# Patient Record
Sex: Male | Born: 1940 | Race: White | Hispanic: No | Marital: Married | Smoking: Never smoker
Health system: Southern US, Community
[De-identification: ages and names within clinical notes are randomized; demographics above are authoritative.]

## PROBLEM LIST (undated history)

## (undated) DIAGNOSIS — I10 Essential (primary) hypertension: Secondary | ICD-10-CM

## (undated) DIAGNOSIS — I2510 Atherosclerotic heart disease of native coronary artery without angina pectoris: Secondary | ICD-10-CM

## (undated) DIAGNOSIS — E119 Type 2 diabetes mellitus without complications: Secondary | ICD-10-CM

---

## 2017-02-22 DIAGNOSIS — N4340 Spermatocele of epididymis, unspecified: Secondary | ICD-10-CM | POA: Diagnosis not present

## 2017-03-25 DIAGNOSIS — J069 Acute upper respiratory infection, unspecified: Secondary | ICD-10-CM | POA: Diagnosis not present

## 2017-04-07 DIAGNOSIS — E782 Mixed hyperlipidemia: Secondary | ICD-10-CM | POA: Diagnosis not present

## 2017-04-07 DIAGNOSIS — I2510 Atherosclerotic heart disease of native coronary artery without angina pectoris: Secondary | ICD-10-CM | POA: Diagnosis not present

## 2017-04-07 DIAGNOSIS — Z951 Presence of aortocoronary bypass graft: Secondary | ICD-10-CM | POA: Diagnosis not present

## 2017-04-07 DIAGNOSIS — I10 Essential (primary) hypertension: Secondary | ICD-10-CM | POA: Diagnosis not present

## 2017-04-25 DIAGNOSIS — I10 Essential (primary) hypertension: Secondary | ICD-10-CM | POA: Diagnosis not present

## 2017-04-25 DIAGNOSIS — D649 Anemia, unspecified: Secondary | ICD-10-CM | POA: Diagnosis not present

## 2017-04-25 DIAGNOSIS — E782 Mixed hyperlipidemia: Secondary | ICD-10-CM | POA: Diagnosis not present

## 2017-04-25 DIAGNOSIS — E1169 Type 2 diabetes mellitus with other specified complication: Secondary | ICD-10-CM | POA: Diagnosis not present

## 2017-05-02 DIAGNOSIS — E782 Mixed hyperlipidemia: Secondary | ICD-10-CM | POA: Diagnosis not present

## 2017-05-02 DIAGNOSIS — J4520 Mild intermittent asthma, uncomplicated: Secondary | ICD-10-CM | POA: Diagnosis not present

## 2017-05-02 DIAGNOSIS — I129 Hypertensive chronic kidney disease with stage 1 through stage 4 chronic kidney disease, or unspecified chronic kidney disease: Secondary | ICD-10-CM | POA: Diagnosis not present

## 2017-05-02 DIAGNOSIS — E1169 Type 2 diabetes mellitus with other specified complication: Secondary | ICD-10-CM | POA: Diagnosis not present

## 2017-05-19 DIAGNOSIS — R0602 Shortness of breath: Secondary | ICD-10-CM | POA: Diagnosis not present

## 2017-05-20 ENCOUNTER — Encounter (HOSPITAL_BASED_OUTPATIENT_CLINIC_OR_DEPARTMENT_OTHER): Payer: Self-pay | Admitting: Emergency Medicine

## 2017-05-20 ENCOUNTER — Emergency Department (HOSPITAL_BASED_OUTPATIENT_CLINIC_OR_DEPARTMENT_OTHER)
Admission: EM | Admit: 2017-05-20 | Discharge: 2017-05-20 | Disposition: A | Discharge status: Home / Self Care | Payer: PPO | Attending: Emergency Medicine | Admitting: Emergency Medicine

## 2017-05-20 ENCOUNTER — Other Ambulatory Visit: Payer: Self-pay

## 2017-05-20 ENCOUNTER — Emergency Department (HOSPITAL_BASED_OUTPATIENT_CLINIC_OR_DEPARTMENT_OTHER): Payer: PPO

## 2017-05-20 DIAGNOSIS — Z7984 Long term (current) use of oral hypoglycemic drugs: Secondary | ICD-10-CM | POA: Diagnosis not present

## 2017-05-20 DIAGNOSIS — I10 Essential (primary) hypertension: Secondary | ICD-10-CM | POA: Insufficient documentation

## 2017-05-20 DIAGNOSIS — Z7902 Long term (current) use of antithrombotics/antiplatelets: Secondary | ICD-10-CM | POA: Diagnosis not present

## 2017-05-20 DIAGNOSIS — I2510 Atherosclerotic heart disease of native coronary artery without angina pectoris: Secondary | ICD-10-CM | POA: Diagnosis not present

## 2017-05-20 DIAGNOSIS — R0602 Shortness of breath: Secondary | ICD-10-CM | POA: Diagnosis not present

## 2017-05-20 DIAGNOSIS — E119 Type 2 diabetes mellitus without complications: Secondary | ICD-10-CM | POA: Insufficient documentation

## 2017-05-20 DIAGNOSIS — Z7982 Long term (current) use of aspirin: Secondary | ICD-10-CM | POA: Diagnosis not present

## 2017-05-20 DIAGNOSIS — Z79899 Other long term (current) drug therapy: Secondary | ICD-10-CM | POA: Diagnosis not present

## 2017-05-20 HISTORY — DX: Atherosclerotic heart disease of native coronary artery without angina pectoris: I25.10

## 2017-05-20 HISTORY — DX: Essential (primary) hypertension: I10

## 2017-05-20 HISTORY — DX: Type 2 diabetes mellitus without complications: E11.9

## 2017-05-20 LAB — CBC WITH DIFFERENTIAL/PLATELET
BASOS ABS: 0.1 10*3/uL (ref 0.0–0.1)
Basophils Relative: 1 %
EOS PCT: 4 %
Eosinophils Absolute: 0.2 10*3/uL (ref 0.0–0.7)
HEMATOCRIT: 34.5 % — AB (ref 39.0–52.0)
HEMOGLOBIN: 11.5 g/dL — AB (ref 13.0–17.0)
LYMPHS ABS: 2.6 10*3/uL (ref 0.7–4.0)
LYMPHS PCT: 43 %
MCH: 31.3 pg (ref 26.0–34.0)
MCHC: 33.3 g/dL (ref 30.0–36.0)
MCV: 94 fL (ref 78.0–100.0)
Monocytes Absolute: 0.7 10*3/uL (ref 0.1–1.0)
Monocytes Relative: 12 %
NEUTROS ABS: 2.3 10*3/uL (ref 1.7–7.7)
NEUTROS PCT: 40 %
PLATELETS: 201 10*3/uL (ref 150–400)
RBC: 3.67 MIL/uL — AB (ref 4.22–5.81)
RDW: 13.5 % (ref 11.5–15.5)
WBC: 5.8 10*3/uL (ref 4.0–10.5)

## 2017-05-20 LAB — COMPREHENSIVE METABOLIC PANEL WITH GFR
ALT: 24 U/L (ref 17–63)
AST: 31 U/L (ref 15–41)
Albumin: 4 g/dL (ref 3.5–5.0)
Alkaline Phosphatase: 42 U/L (ref 38–126)
Anion gap: 9 (ref 5–15)
BUN: 22 mg/dL — ABNORMAL HIGH (ref 6–20)
CO2: 20 mmol/L — ABNORMAL LOW (ref 22–32)
Calcium: 9.2 mg/dL (ref 8.9–10.3)
Chloride: 110 mmol/L (ref 101–111)
Creatinine, Ser: 1.28 mg/dL — ABNORMAL HIGH (ref 0.61–1.24)
GFR calc Af Amer: >60 mL/min
GFR calc non Af Amer: 52 mL/min — ABNORMAL LOW
Glucose, Bld: 144 mg/dL — ABNORMAL HIGH (ref 65–99)
Potassium: 4.1 mmol/L (ref 3.5–5.1)
Sodium: 139 mmol/L (ref 135–145)
Total Bilirubin: 0.6 mg/dL (ref 0.3–1.2)
Total Protein: 6.7 g/dL (ref 6.5–8.1)

## 2017-05-20 LAB — TROPONIN I: Troponin I: <0.03 ng/mL

## 2017-05-20 LAB — BRAIN NATRIURETIC PEPTIDE: B Natriuretic Peptide: 70.4 pg/mL (ref 0.0–100.0)

## 2017-05-20 NOTE — Discharge Instructions (Signed)
Return to the ED with any concerns including chest pain, fainting, leg swelling, worsening shortness of breath, vomiting, decreased level of alertness/lethargy, or any other alarming symptoms

## 2017-05-20 NOTE — ED Provider Notes (Signed)
[facility] EMERGENCY DEPARTMENT Provider Note   CSN: [id number] Arrival date & time: 05/20/17  0705     History   Chief Complaint Chief Complaint  Patient presents with  . Shortness of Breath    HPI Jesse Green is a 77 y.o. male.  HPI  Pt with hx of Chronic coronary artery disease status post drug-eluting stent to the SVG to OM branch 4/15, . S/P CABG (coronary artery bypass graft) 2005 , Essential hypertension presenting with c/o shortness of breath when lying flat.  He has no leg swelling.  He denies chest pain.  He states that he would be able to walk down the hall here in the ED without much shortness of breath but if he exerts himself very much he becomes short of breath.  This is resolved by rest.  He has had to sleep in a recliner several nights due to feeling short of breath.  He has not had any changes in his medications or his diet that he is aware of.  He has no fever or significant cough.  He went to his cardiologist's office yesterday and they advised him to have a chest x-ray.  He is speaking in full sentences and currently not short of breath in the stretcher.  There are no other associated systemic symptoms, there are no other alleviating or modifying factors.        Past Medical History:  Diagnosis Date  . Coronary artery disease   . Diabetes mellitus without complication ([place])   . Hypertension     There are no active problems to display for this patient.   Social history- married Fam hx - noncontributory     Home Medications    Prior to Admission medications   Medication Sig Start Date End Date Taking? Authorizing Provider  amLODipine (NORVASC) 10 MG tablet Take 1 tablet by mouth daily. 06/22/16  Yes [provider]  aspirin EC 81 MG tablet Take 1 tablet by mouth daily. 11/27/12  Yes [provider]  clopidogrel (PLAVIX) 75 MG tablet Take 1 tablet by mouth daily. 03/05/16  Yes [provider]  gabapentin  (NEURONTIN) 300 MG capsule Take 2 capsules by mouth 2 (two) times daily. 06/22/16  Yes [provider]  metFORMIN (GLUCOPHAGE) 1000 MG tablet Take 1 tablet by mouth 2 (two) times daily. 06/16/16  Yes [provider]  GLIPIZIDE XL 5 MG 24 hr tablet Take 1 tablet by mouth daily. 03/18/17   [provider]  losartan (COZAAR) 50 MG tablet Take 2 tablets by mouth daily. 04/28/17   [provider]  metoprolol (TOPROL-XL) 200 MG 24 hr tablet Take 200 mg by mouth daily. 04/28/17   [provider]  oxybutynin (DITROPAN-XL) 5 MG 24 hr tablet Take 1 tablet by mouth 2 (two) times daily. 03/22/17   [provider]  rosuvastatin (CRESTOR) 10 MG tablet Take 10 mg by mouth at bedtime. 04/29/17   [provider]  . amLODIPine (NORVASC) 10 MG tablet, TAKE 1 TABLET BY MOUTH DAILY, Disp: 90 tablet, Rfl: 1 . aspirin 81 MG EC tablet, Take by mouth daily. , Disp: , Rfl:  . azithromycin (ZITHROMAX) 250 MG tablet, TAKE 2 TABLETS BY MOUTH TODAY, THEN TAKE 1 TABLET DAILY FOR 4 DAYS, Disp: , Rfl: 0 . blood sugar diagnostic Strp, CHECK BLOOD SUGAR TWICE A DAY, Disp: 200 strip, Rfl: 1 . clopidogrel (PLAVIX) 75 mg tablet *ANTIPLATELET*, TAKE 1 TABLET BY MOUTH DAILY, Disp: 90 tablet, Rfl: 3 .  gabapentin (NEURONTIN) 300 MG capsule, TAKE 2 CAPSULES BY MOUTH EVERY EVENING, Disp: 180 capsule, Rfl: 1 . glipiZIDE XL (GLUCOTROL XL) 5 MG 24 hr tablet, Take 1 tablet (5 mg total) by mouth 2 times daily., Disp: 180 tablet, Rfl: 1 . losartan-hydrochlorothiazide (HYZAAR) 50-12.5 mg per tablet, TAKE 2 TABLETS BY MOUTH DAILY, Disp: 180 tablet, Rfl: 3 . metFORMIN (GLUCOPHAGE) 1000 MG tablet, TAKE 1 TABLET BY MOUTH TWICE A DAY WITH FOOD, Disp: 180 tablet, Rfl: 1 . metoprolol (TOPROL-XL) 200 MG 24 hr tablet, TAKE 1 TABLET BY MOUTH DAILY, Disp: 90 tablet, Rfl: 1 . multivit-iron-min-folic acid (MULTIVITAMIN-IRON-MINERALS-FOLIC ACID) 9,381-01-7.5 unit-mg-mg Chew, Take by mouth daily. , Disp: , Rfl:   . omega-3 acid ethyl esters (LOVAZA) 1 gram capsule, Take 2 g by mouth 2 (two) times daily. , Disp: , Rfl:  . oxybutynin XL (DITROPAN XL) 5 MG extended release tablet, Take 1 tablet (5 mg total) by mouth 2 times daily., Disp: 60 tablet, Rfl: PRN . rosuvastatin (CRESTOR) 10 MG tablet, TAKE 1 TABLET BY MOUTH NIGHTLY, Disp: 90 tablet, Rfl: 1 . triamcinolone (KENALOG) 0.1 % ointment, Use on affected areas 1-2 times daily as needed for itching. Not to use on face, groin, armpit., Disp: 80 g, Rfl: 3 . miscellaneous medical supply Misc, Lift Chair, Disp: , Rfl:  . nitroglycerin (NITROSTAT) 0.4 MG SL tablet, Place 1 tablet (0.4 mg total) under the tongue every 5 (five) minutes as needed., Disp: 25 tablet, Rfl: 3      Family History History reviewed. No pertinent family history.  Social History Social History   Tobacco Use  . Smoking status: Never Smoker  . Smokeless tobacco: Never Used  Substance Use Topics  . Alcohol use: Never    Frequency: Never  . Drug use: Never     Allergies   Allopurinol; Hydrocodone-acetaminophen; Quinapril; Propafenone; and Tramadol   Review of Systems Review of Systems  ROS reviewed and all otherwise negative except for mentioned in HPI   Physical Exam Updated Vital Signs BP 119/84   Pulse 60   Temp 98.3 F (36.8 C)   Resp (!) 22   Ht 5\' 9"  (1.753 m)   Wt 86.8 kg (191 lb 5 oz)   SpO2 96%   BMI 28.25 kg/m  Vitals reviewed Physical Exam  Physical Examination: General appearance - alert, well appearing, and in no distress Mental status - alert, oriented to person, place, and time Eyes - no conjunctival injection, no scleral icterus Mouth - mucous membranes moist, pharynx normal without lesions Neck - supple, no significant adenopathy Chest - clear to auscultation, no wheezes, rales or rhonchi, symmetric air entry Heart - normal rate, regular rhythm, normal S1, S2, no murmurs, rubs, clicks or gallops Abdomen - soft, nontender, nondistended, no  masses or organomegaly Neurological - alert, oriented, normal speech Extremities - peripheral pulses normal, no pedal edema, no clubbing or cyanosis Skin - normal coloration and turgor, no rashes   ED Treatments / Results  Labs (all labs ordered are listed, but only abnormal results are displayed) Labs Reviewed  CBC WITH DIFFERENTIAL/PLATELET - Abnormal; Notable for the following components:      Result Value   RBC 3.67 (*)    Hemoglobin 11.5 (*)    HCT 34.5 (*)    All other components within normal limits  COMPREHENSIVE METABOLIC PANEL - Abnormal; Notable for the following components:   CO2 20 (*)    Glucose, Bld 144 (*)    BUN 22 (*)  Creatinine, Ser 1.28 (*)    GFR calc non Af Amer 52 (*)    All other components within normal limits  BRAIN NATRIURETIC PEPTIDE  TROPONIN I  TROPONIN I    EKG EKG Interpretation  Date/Time:  Friday May 20 2017 07:22:06 EDT Ventricular Rate:  64 PR Interval:    QRS Duration: 149 QT Interval:  424 QTC Calculation: 438 R Axis:   3 Text Interpretation:  Sinus rhythm Atrial premature complex Prolonged PR interval Right bundle branch block No old tracing to compare Confirmed by Alfonzo Beers [id number]) on 05/20/2017 7:33:32 AM   Radiology Dg Chest 2 View  Result Date: 05/20/2017 CLINICAL DATA:  Shortness of breath for several months EXAM: CHEST - 2 VIEW COMPARISON:  11/03/2010 FINDINGS: Cardiac shadow is within normal limits. The lungs are well aerated bilaterally. Postsurgical changes are again seen and stable. No bony abnormality is noted. IMPRESSION: No active cardiopulmonary disease. Electronically Signed   By: Inez Catalina M.D.   On: 05/20/2017 08:16    Procedures Procedures (including critical care time)  Medications Ordered in ED Medications - No data to display   Initial Impression / Assessment and Plan / ED Course  I have reviewed the triage vital signs and the nursing notes.  Pertinent labs & imaging results that were  available during my care of the patient were reviewed by me and considered in my medical decision making (see chart for details).     Patient with history of coronary artery disease diabetes and hypertension presents with complaint of shortness of breath when he lies down.  He has no active shortness of breath or chest pain in the ED.  His workup including chest x-ray and 2 sets of troponins and EKG were reassuring.  Doubt heart failure, ACS, pneumonia.  Symptoms are not consistent with PE.  He plans to call his cardiologist today to schedule a follow-up appointment.  Discharged with strict return precautions.  Pt agreeable with plan.  Final Clinical Impressions(s) / ED Diagnoses   Final diagnoses:  Shortness of breath    ED Discharge Orders    None       Pixie Casino, MD 05/20/17 1224

## 2017-05-20 NOTE — ED Triage Notes (Signed)
Pt states he is having some sob when lying flat x 2 months.  Pt does have some DOE at baseline but seems to have increased a tiny bit.  Pt states no increase in swelling.  Pt states he tried to see his cardiologist yesterday but was told to come to ED.

## 2017-06-08 DIAGNOSIS — L812 Freckles: Secondary | ICD-10-CM | POA: Diagnosis not present

## 2017-06-08 DIAGNOSIS — L821 Other seborrheic keratosis: Secondary | ICD-10-CM | POA: Diagnosis not present

## 2017-06-08 DIAGNOSIS — D1801 Hemangioma of skin and subcutaneous tissue: Secondary | ICD-10-CM | POA: Diagnosis not present

## 2017-06-08 DIAGNOSIS — L578 Other skin changes due to chronic exposure to nonionizing radiation: Secondary | ICD-10-CM | POA: Diagnosis not present

## 2017-06-08 DIAGNOSIS — D229 Melanocytic nevi, unspecified: Secondary | ICD-10-CM | POA: Diagnosis not present

## 2017-06-08 DIAGNOSIS — I781 Nevus, non-neoplastic: Secondary | ICD-10-CM | POA: Diagnosis not present

## 2017-06-08 DIAGNOSIS — L814 Other melanin hyperpigmentation: Secondary | ICD-10-CM | POA: Diagnosis not present

## 2017-06-08 DIAGNOSIS — L570 Actinic keratosis: Secondary | ICD-10-CM | POA: Diagnosis not present

## 2017-06-08 DIAGNOSIS — Z1283 Encounter for screening for malignant neoplasm of skin: Secondary | ICD-10-CM | POA: Diagnosis not present

## 2017-06-13 DIAGNOSIS — C61 Malignant neoplasm of prostate: Secondary | ICD-10-CM | POA: Diagnosis not present

## 2017-06-20 DIAGNOSIS — C61 Malignant neoplasm of prostate: Secondary | ICD-10-CM | POA: Diagnosis not present

## 2017-06-20 DIAGNOSIS — N4340 Spermatocele of epididymis, unspecified: Secondary | ICD-10-CM | POA: Diagnosis not present

## 2017-06-20 DIAGNOSIS — N433 Hydrocele, unspecified: Secondary | ICD-10-CM | POA: Diagnosis not present

## 2017-07-19 DIAGNOSIS — M25461 Effusion, right knee: Secondary | ICD-10-CM | POA: Diagnosis not present

## 2017-07-19 DIAGNOSIS — M170 Bilateral primary osteoarthritis of knee: Secondary | ICD-10-CM | POA: Diagnosis not present

## 2017-07-19 DIAGNOSIS — M25561 Pain in right knee: Secondary | ICD-10-CM | POA: Diagnosis not present

## 2017-07-19 DIAGNOSIS — M25562 Pain in left knee: Secondary | ICD-10-CM | POA: Diagnosis not present

## 2017-07-19 DIAGNOSIS — M1711 Unilateral primary osteoarthritis, right knee: Secondary | ICD-10-CM | POA: Diagnosis not present

## 2017-07-25 DIAGNOSIS — R413 Other amnesia: Secondary | ICD-10-CM | POA: Diagnosis not present

## 2017-07-26 DIAGNOSIS — Z7902 Long term (current) use of antithrombotics/antiplatelets: Secondary | ICD-10-CM | POA: Diagnosis not present

## 2017-07-26 DIAGNOSIS — Z79899 Other long term (current) drug therapy: Secondary | ICD-10-CM | POA: Diagnosis not present

## 2017-07-26 DIAGNOSIS — I10 Essential (primary) hypertension: Secondary | ICD-10-CM | POA: Diagnosis not present

## 2017-07-26 DIAGNOSIS — I2510 Atherosclerotic heart disease of native coronary artery without angina pectoris: Secondary | ICD-10-CM | POA: Diagnosis not present

## 2017-07-26 DIAGNOSIS — I872 Venous insufficiency (chronic) (peripheral): Secondary | ICD-10-CM | POA: Diagnosis not present

## 2017-08-08 DIAGNOSIS — E1169 Type 2 diabetes mellitus with other specified complication: Secondary | ICD-10-CM | POA: Diagnosis not present

## 2017-08-08 DIAGNOSIS — D631 Anemia in chronic kidney disease: Secondary | ICD-10-CM | POA: Diagnosis not present

## 2017-08-08 DIAGNOSIS — I10 Essential (primary) hypertension: Secondary | ICD-10-CM | POA: Diagnosis not present

## 2017-08-08 DIAGNOSIS — N183 Chronic kidney disease, stage 3 (moderate): Secondary | ICD-10-CM | POA: Diagnosis not present

## 2017-08-08 DIAGNOSIS — E782 Mixed hyperlipidemia: Secondary | ICD-10-CM | POA: Diagnosis not present

## 2017-08-15 DIAGNOSIS — N183 Chronic kidney disease, stage 3 (moderate): Secondary | ICD-10-CM | POA: Diagnosis not present

## 2017-08-15 DIAGNOSIS — I25119 Atherosclerotic heart disease of native coronary artery with unspecified angina pectoris: Secondary | ICD-10-CM | POA: Diagnosis not present

## 2017-08-15 DIAGNOSIS — Z0000 Encounter for general adult medical examination without abnormal findings: Secondary | ICD-10-CM | POA: Diagnosis not present

## 2017-08-15 DIAGNOSIS — I129 Hypertensive chronic kidney disease with stage 1 through stage 4 chronic kidney disease, or unspecified chronic kidney disease: Secondary | ICD-10-CM | POA: Diagnosis not present

## 2017-08-15 DIAGNOSIS — I739 Peripheral vascular disease, unspecified: Secondary | ICD-10-CM | POA: Diagnosis not present

## 2017-08-18 DIAGNOSIS — M25561 Pain in right knee: Secondary | ICD-10-CM | POA: Diagnosis not present

## 2017-08-18 DIAGNOSIS — M1711 Unilateral primary osteoarthritis, right knee: Secondary | ICD-10-CM | POA: Diagnosis not present

## 2017-08-18 DIAGNOSIS — M1712 Unilateral primary osteoarthritis, left knee: Secondary | ICD-10-CM | POA: Diagnosis not present

## 2017-08-18 DIAGNOSIS — M25562 Pain in left knee: Secondary | ICD-10-CM | POA: Diagnosis not present

## 2017-08-29 DIAGNOSIS — R0781 Pleurodynia: Secondary | ICD-10-CM | POA: Diagnosis not present

## 2017-08-29 DIAGNOSIS — S299XXA Unspecified injury of thorax, initial encounter: Secondary | ICD-10-CM | POA: Diagnosis not present

## 2017-08-29 DIAGNOSIS — G8921 Chronic pain due to trauma: Secondary | ICD-10-CM | POA: Diagnosis not present

## 2017-08-29 DIAGNOSIS — M549 Dorsalgia, unspecified: Secondary | ICD-10-CM | POA: Diagnosis not present

## 2017-09-07 DIAGNOSIS — H2513 Age-related nuclear cataract, bilateral: Secondary | ICD-10-CM | POA: Diagnosis not present

## 2017-09-07 DIAGNOSIS — H524 Presbyopia: Secondary | ICD-10-CM | POA: Diagnosis not present

## 2017-09-07 DIAGNOSIS — E119 Type 2 diabetes mellitus without complications: Secondary | ICD-10-CM | POA: Diagnosis not present

## 2017-09-07 DIAGNOSIS — H5203 Hypermetropia, bilateral: Secondary | ICD-10-CM | POA: Diagnosis not present

## 2017-09-17 DIAGNOSIS — Z7982 Long term (current) use of aspirin: Secondary | ICD-10-CM | POA: Diagnosis not present

## 2017-09-17 DIAGNOSIS — I4510 Unspecified right bundle-branch block: Secondary | ICD-10-CM | POA: Diagnosis not present

## 2017-09-17 DIAGNOSIS — Z951 Presence of aortocoronary bypass graft: Secondary | ICD-10-CM | POA: Diagnosis not present

## 2017-09-17 DIAGNOSIS — Z8673 Personal history of transient ischemic attack (TIA), and cerebral infarction without residual deficits: Secondary | ICD-10-CM | POA: Diagnosis not present

## 2017-09-17 DIAGNOSIS — I440 Atrioventricular block, first degree: Secondary | ICD-10-CM | POA: Diagnosis not present

## 2017-09-17 DIAGNOSIS — M109 Gout, unspecified: Secondary | ICD-10-CM | POA: Diagnosis not present

## 2017-09-17 DIAGNOSIS — I25118 Atherosclerotic heart disease of native coronary artery with other forms of angina pectoris: Secondary | ICD-10-CM | POA: Diagnosis not present

## 2017-09-17 DIAGNOSIS — E1142 Type 2 diabetes mellitus with diabetic polyneuropathy: Secondary | ICD-10-CM | POA: Diagnosis not present

## 2017-09-17 DIAGNOSIS — R072 Precordial pain: Secondary | ICD-10-CM | POA: Diagnosis not present

## 2017-09-17 DIAGNOSIS — T464X5A Adverse effect of angiotensin-converting-enzyme inhibitors, initial encounter: Secondary | ICD-10-CM | POA: Diagnosis not present

## 2017-09-17 DIAGNOSIS — R0602 Shortness of breath: Secondary | ICD-10-CM | POA: Diagnosis not present

## 2017-09-17 DIAGNOSIS — E1165 Type 2 diabetes mellitus with hyperglycemia: Secondary | ICD-10-CM | POA: Diagnosis not present

## 2017-09-17 DIAGNOSIS — Z8249 Family history of ischemic heart disease and other diseases of the circulatory system: Secondary | ICD-10-CM | POA: Diagnosis not present

## 2017-09-17 DIAGNOSIS — Z7901 Long term (current) use of anticoagulants: Secondary | ICD-10-CM | POA: Diagnosis not present

## 2017-09-17 DIAGNOSIS — Z7902 Long term (current) use of antithrombotics/antiplatelets: Secondary | ICD-10-CM | POA: Diagnosis not present

## 2017-09-17 DIAGNOSIS — Z823 Family history of stroke: Secondary | ICD-10-CM | POA: Diagnosis not present

## 2017-09-17 DIAGNOSIS — I129 Hypertensive chronic kidney disease with stage 1 through stage 4 chronic kidney disease, or unspecified chronic kidney disease: Secondary | ICD-10-CM | POA: Diagnosis not present

## 2017-09-17 DIAGNOSIS — E785 Hyperlipidemia, unspecified: Secondary | ICD-10-CM | POA: Diagnosis not present

## 2017-09-17 DIAGNOSIS — I252 Old myocardial infarction: Secondary | ICD-10-CM | POA: Diagnosis not present

## 2017-09-17 DIAGNOSIS — I25708 Atherosclerosis of coronary artery bypass graft(s), unspecified, with other forms of angina pectoris: Secondary | ICD-10-CM | POA: Diagnosis not present

## 2017-09-17 DIAGNOSIS — I2582 Chronic total occlusion of coronary artery: Secondary | ICD-10-CM | POA: Diagnosis not present

## 2017-09-17 DIAGNOSIS — E782 Mixed hyperlipidemia: Secondary | ICD-10-CM | POA: Diagnosis not present

## 2017-09-17 DIAGNOSIS — I2510 Atherosclerotic heart disease of native coronary artery without angina pectoris: Secondary | ICD-10-CM | POA: Diagnosis not present

## 2017-09-17 DIAGNOSIS — Z794 Long term (current) use of insulin: Secondary | ICD-10-CM | POA: Diagnosis not present

## 2017-09-17 DIAGNOSIS — Z79899 Other long term (current) drug therapy: Secondary | ICD-10-CM | POA: Diagnosis not present

## 2017-09-17 DIAGNOSIS — Z955 Presence of coronary angioplasty implant and graft: Secondary | ICD-10-CM | POA: Diagnosis not present

## 2017-09-17 DIAGNOSIS — R0789 Other chest pain: Secondary | ICD-10-CM | POA: Diagnosis not present

## 2017-09-17 DIAGNOSIS — N189 Chronic kidney disease, unspecified: Secondary | ICD-10-CM | POA: Diagnosis not present

## 2017-09-17 DIAGNOSIS — I10 Essential (primary) hypertension: Secondary | ICD-10-CM | POA: Diagnosis not present

## 2017-09-17 DIAGNOSIS — E1122 Type 2 diabetes mellitus with diabetic chronic kidney disease: Secondary | ICD-10-CM | POA: Diagnosis not present

## 2017-09-17 DIAGNOSIS — N179 Acute kidney failure, unspecified: Secondary | ICD-10-CM | POA: Diagnosis not present

## 2017-09-18 DIAGNOSIS — I252 Old myocardial infarction: Secondary | ICD-10-CM | POA: Diagnosis not present

## 2017-09-18 DIAGNOSIS — I440 Atrioventricular block, first degree: Secondary | ICD-10-CM | POA: Diagnosis not present

## 2017-09-18 DIAGNOSIS — I4510 Unspecified right bundle-branch block: Secondary | ICD-10-CM | POA: Diagnosis not present

## 2017-09-21 ENCOUNTER — Other Ambulatory Visit: Payer: Self-pay | Admitting: *Deleted

## 2017-09-21 DIAGNOSIS — I440 Atrioventricular block, first degree: Secondary | ICD-10-CM | POA: Diagnosis not present

## 2017-09-21 DIAGNOSIS — R0600 Dyspnea, unspecified: Secondary | ICD-10-CM | POA: Diagnosis not present

## 2017-09-21 DIAGNOSIS — R609 Edema, unspecified: Secondary | ICD-10-CM | POA: Diagnosis not present

## 2017-09-21 DIAGNOSIS — R0602 Shortness of breath: Secondary | ICD-10-CM | POA: Diagnosis not present

## 2017-09-21 DIAGNOSIS — F419 Anxiety disorder, unspecified: Secondary | ICD-10-CM | POA: Diagnosis not present

## 2017-09-21 DIAGNOSIS — I4510 Unspecified right bundle-branch block: Secondary | ICD-10-CM | POA: Diagnosis not present

## 2017-09-21 NOTE — Patient Outreach (Signed)
[facility]) Care Management  09/21/2017  OMARIO ANDER 1940/10/13 [id number]  Transition of Care Referral   Referral Date:  09/21/17 Referral Source:  HTA IP discharge Date of Admission:  Unknown  Diagnosis: unknown Date of Discharge: 09/19/17 Facility:  [facility] Insurance: HTA  Outreach attempt #1 No answer. [facility] RN CM left HIPAA compliant voicemail message along with CM's contact info.   [facility] RN CM sent an unsuccessful outreach letter and scheduled this patient for another call attempt within 4 business days  Daimen Shovlin L. Lavina Hamman, RN, BSN, [place] Coordinator Office number [phone] Mobile number [phone]  Main [facility] number [phone] Fax number [phone]

## 2017-09-22 DIAGNOSIS — I440 Atrioventricular block, first degree: Secondary | ICD-10-CM | POA: Diagnosis not present

## 2017-09-22 DIAGNOSIS — F419 Anxiety disorder, unspecified: Secondary | ICD-10-CM | POA: Diagnosis not present

## 2017-09-22 DIAGNOSIS — I2510 Atherosclerotic heart disease of native coronary artery without angina pectoris: Secondary | ICD-10-CM | POA: Diagnosis not present

## 2017-09-22 DIAGNOSIS — F5101 Primary insomnia: Secondary | ICD-10-CM | POA: Diagnosis not present

## 2017-09-22 DIAGNOSIS — I4510 Unspecified right bundle-branch block: Secondary | ICD-10-CM | POA: Diagnosis not present

## 2017-09-25 DIAGNOSIS — F419 Anxiety disorder, unspecified: Secondary | ICD-10-CM | POA: Diagnosis not present

## 2017-09-25 DIAGNOSIS — R0602 Shortness of breath: Secondary | ICD-10-CM | POA: Diagnosis not present

## 2017-09-26 ENCOUNTER — Other Ambulatory Visit: Payer: Self-pay | Admitting: *Deleted

## 2017-09-26 NOTE — Patient Outreach (Addendum)
[facility]) Care Management  09/26/2017  NYJAH DENIO Sep 02, 1940 [id number]   Transition of Care Referral  Referral Date:  09/21/17 Referral Source:  HTA IP discharge Date of Admission:  09/17/17         Diagnosis:sob, left heart cath with coronary stent replacement  Date of Discharge: 09/19/17 Facility:  [facility] Insurance: HTA  Outreach attempt # 2 successful at the home number  When Mr Mcqueen answered the telephone he was speaking in a normal voice but as he continued to speak with CM he began to state the he was having trouble breathing and informed CM she would have to speak with his wife because when "I was at the hospital they catheterized me and gave me a medicine that went to my brain and it makes me gasp".  Prior to him giving the telephone to his wife, Silva Bandy CM noted an increase in gasping sounds from Mr Kiper and attempted to guide him through deep breathing method but he was not able to do the process.  Mrs Palecek apologizes to Cm when answering the telephone. She reports they have just walked in the home.  She clarifies that Mr Lagrange has "always been a go getter and was not satisfied if he was not busy." She states prior to his hospitalization Mr Coberly began to complain of breathing issues, he was examined and the cardiologist wanted to complete a cardiac catheterization, stent replacement and a medication was given that has caused Mr Gumina behavior to change.  She confirms he has been evaluated by the hospital, cardiologist and primary MD with various test.  She confirms he is not on oxygen and does not qualify for oxygen as his oxygen sats have been checked by MDs and her and found to remain in the high 90s.    She reports he is scheduled to see Dr Lendon Colonel, psychiatrist in [place] but the earliest appointment is November 02 2017.   CM discussed the differences and collaboration of  psychology and psychiatry to include  [facility] mental health services but she denies need for assistance. She was offered a list of [facility] resources from [employer]. She expresses that she is familiar with "Beverly Sessions" (her daughter) and does not like the services offered. CM discussed with her that [facility] may be able to offer other mental health providers but she denies interest  Mrs Berns reports they have been to ED x 2 (09/21/17 & 09/25/17) and during 09/25/17 ED visit for anxiety he was evaluated by the [facility] team and referred to Dr Erling Cruz  He was seen by Dr Carla Drape on 09/22/17  Audria Nine, NP on 10-04-17 11:40 am cardiology appt on 10-05-17  Social Mr Damiano lives with his wife and she reports he is physically independent but "mentally needs help"  She confirms he had not attempted to drive. Mrs Deese does all the driving to and from appointments and errands "His breathing issues are in his mind"    Conditions: anxiety, stent replacement, DM, albuminuria, memory loss due to medical condition, CKD, normochromic anemia, PAD, venous insuffiencey , s/p CABG 07/16/14, gout asthma, htn,  hyperlipidemia    Medications She confirms he is now on Lexapro since d/c from hospital on 09/19/17 and a sleep medication She denies concerns with taking medications as prescribed, affording medications, side effects of medications and questions about medications All discharge medications were purchased without cost issues or questions  Advance directives Denies  need for assist with or assist with changes for advance directives    Plan- [facility] RN CM will close case at this time as patient has been assessed and no needs identified  Mrs Baca was given [facility] RN CM contact information    Plumer Mittelstaedt L. Lavina Hamman, RN, BSN, [place] Coordinator Office number [phone] Mobile number [phone]  Main [facility] number [phone] Fax number [phone]

## 2017-09-27 ENCOUNTER — Other Ambulatory Visit: Payer: Self-pay | Admitting: *Deleted

## 2017-09-27 NOTE — Patient Outreach (Signed)
[facility]) Care Management  09/27/2017  Jesse Green 25-Aug-1940 [id number]   Member identified as high risk according to [employer] health questionnaire.  Call placed to introduce [name] care management services and perform telephone screening.  This care manager introduced self and purpose of call to member's wife.  Noted that telephone screening already complete yesterday by [name].  Wife declined services for member at this time.  Will contact [facility] if decision made to participate.  [name], [place], MSN [place] [phone]

## 2017-10-05 DIAGNOSIS — I10 Essential (primary) hypertension: Secondary | ICD-10-CM | POA: Diagnosis not present

## 2017-10-05 DIAGNOSIS — I2510 Atherosclerotic heart disease of native coronary artery without angina pectoris: Secondary | ICD-10-CM | POA: Diagnosis not present

## 2017-10-05 DIAGNOSIS — Z951 Presence of aortocoronary bypass graft: Secondary | ICD-10-CM | POA: Diagnosis not present

## 2017-10-05 DIAGNOSIS — Z7982 Long term (current) use of aspirin: Secondary | ICD-10-CM | POA: Diagnosis not present

## 2017-10-05 DIAGNOSIS — E782 Mixed hyperlipidemia: Secondary | ICD-10-CM | POA: Diagnosis not present

## 2017-10-06 DIAGNOSIS — R0602 Shortness of breath: Secondary | ICD-10-CM | POA: Diagnosis not present

## 2017-10-06 DIAGNOSIS — Z79899 Other long term (current) drug therapy: Secondary | ICD-10-CM | POA: Diagnosis not present

## 2017-10-06 DIAGNOSIS — G4700 Insomnia, unspecified: Secondary | ICD-10-CM | POA: Diagnosis not present

## 2017-10-06 DIAGNOSIS — I2510 Atherosclerotic heart disease of native coronary artery without angina pectoris: Secondary | ICD-10-CM | POA: Diagnosis not present

## 2017-10-06 DIAGNOSIS — R29818 Other symptoms and signs involving the nervous system: Secondary | ICD-10-CM | POA: Diagnosis not present

## 2017-10-06 DIAGNOSIS — I10 Essential (primary) hypertension: Secondary | ICD-10-CM | POA: Diagnosis not present

## 2017-10-06 DIAGNOSIS — F419 Anxiety disorder, unspecified: Secondary | ICD-10-CM | POA: Diagnosis not present

## 2017-10-06 DIAGNOSIS — Z9889 Other specified postprocedural states: Secondary | ICD-10-CM | POA: Diagnosis not present

## 2017-10-06 DIAGNOSIS — Z9181 History of falling: Secondary | ICD-10-CM | POA: Diagnosis not present

## 2017-10-06 DIAGNOSIS — R0989 Other specified symptoms and signs involving the circulatory and respiratory systems: Secondary | ICD-10-CM | POA: Diagnosis not present

## 2017-10-06 DIAGNOSIS — I6523 Occlusion and stenosis of bilateral carotid arteries: Secondary | ICD-10-CM | POA: Diagnosis not present

## 2017-10-13 DIAGNOSIS — F419 Anxiety disorder, unspecified: Secondary | ICD-10-CM | POA: Diagnosis not present

## 2017-10-13 DIAGNOSIS — R413 Other amnesia: Secondary | ICD-10-CM | POA: Diagnosis not present

## 2017-10-13 DIAGNOSIS — I2510 Atherosclerotic heart disease of native coronary artery without angina pectoris: Secondary | ICD-10-CM | POA: Diagnosis not present

## 2017-10-13 DIAGNOSIS — R4182 Altered mental status, unspecified: Secondary | ICD-10-CM | POA: Diagnosis not present

## 2017-10-13 DIAGNOSIS — F5104 Psychophysiologic insomnia: Secondary | ICD-10-CM | POA: Diagnosis not present

## 2017-10-31 DIAGNOSIS — R197 Diarrhea, unspecified: Secondary | ICD-10-CM | POA: Diagnosis not present

## 2017-10-31 DIAGNOSIS — R109 Unspecified abdominal pain: Secondary | ICD-10-CM | POA: Diagnosis not present

## 2017-11-02 DIAGNOSIS — F411 Generalized anxiety disorder: Secondary | ICD-10-CM | POA: Diagnosis not present

## 2017-11-11 DIAGNOSIS — Z23 Encounter for immunization: Secondary | ICD-10-CM | POA: Diagnosis not present

## 2017-11-11 DIAGNOSIS — S31829A Unspecified open wound of left buttock, initial encounter: Secondary | ICD-10-CM | POA: Diagnosis not present

## 2017-11-11 DIAGNOSIS — Z7189 Other specified counseling: Secondary | ICD-10-CM | POA: Diagnosis not present

## 2017-11-11 DIAGNOSIS — S51002A Unspecified open wound of left elbow, initial encounter: Secondary | ICD-10-CM | POA: Diagnosis not present

## 2017-11-18 DIAGNOSIS — I129 Hypertensive chronic kidney disease with stage 1 through stage 4 chronic kidney disease, or unspecified chronic kidney disease: Secondary | ICD-10-CM | POA: Diagnosis not present

## 2017-11-18 DIAGNOSIS — E782 Mixed hyperlipidemia: Secondary | ICD-10-CM | POA: Diagnosis not present

## 2017-11-18 DIAGNOSIS — E1142 Type 2 diabetes mellitus with diabetic polyneuropathy: Secondary | ICD-10-CM | POA: Diagnosis not present

## 2017-11-18 DIAGNOSIS — N189 Chronic kidney disease, unspecified: Secondary | ICD-10-CM | POA: Diagnosis not present

## 2017-11-18 DIAGNOSIS — S31829D Unspecified open wound of left buttock, subsequent encounter: Secondary | ICD-10-CM | POA: Diagnosis not present

## 2017-11-18 DIAGNOSIS — J00 Acute nasopharyngitis [common cold]: Secondary | ICD-10-CM | POA: Diagnosis not present

## 2017-11-28 DIAGNOSIS — E1142 Type 2 diabetes mellitus with diabetic polyneuropathy: Secondary | ICD-10-CM | POA: Diagnosis not present

## 2017-11-28 DIAGNOSIS — D631 Anemia in chronic kidney disease: Secondary | ICD-10-CM | POA: Diagnosis not present

## 2017-11-28 DIAGNOSIS — I872 Venous insufficiency (chronic) (peripheral): Secondary | ICD-10-CM | POA: Diagnosis not present

## 2017-11-28 DIAGNOSIS — I25119 Atherosclerotic heart disease of native coronary artery with unspecified angina pectoris: Secondary | ICD-10-CM | POA: Diagnosis not present

## 2017-11-28 DIAGNOSIS — J45909 Unspecified asthma, uncomplicated: Secondary | ICD-10-CM | POA: Diagnosis not present

## 2017-11-28 DIAGNOSIS — E1122 Type 2 diabetes mellitus with diabetic chronic kidney disease: Secondary | ICD-10-CM | POA: Diagnosis not present

## 2017-11-28 DIAGNOSIS — I129 Hypertensive chronic kidney disease with stage 1 through stage 4 chronic kidney disease, or unspecified chronic kidney disease: Secondary | ICD-10-CM | POA: Diagnosis not present

## 2017-11-28 DIAGNOSIS — R413 Other amnesia: Secondary | ICD-10-CM | POA: Diagnosis not present

## 2017-11-28 DIAGNOSIS — N183 Chronic kidney disease, stage 3 (moderate): Secondary | ICD-10-CM | POA: Diagnosis not present

## 2017-11-28 DIAGNOSIS — F5104 Psychophysiologic insomnia: Secondary | ICD-10-CM | POA: Diagnosis not present

## 2017-11-28 DIAGNOSIS — R351 Nocturia: Secondary | ICD-10-CM | POA: Diagnosis not present

## 2017-11-28 DIAGNOSIS — Z0000 Encounter for general adult medical examination without abnormal findings: Secondary | ICD-10-CM | POA: Diagnosis not present

## 2017-11-29 DIAGNOSIS — R197 Diarrhea, unspecified: Secondary | ICD-10-CM | POA: Diagnosis not present

## 2017-12-05 DIAGNOSIS — F411 Generalized anxiety disorder: Secondary | ICD-10-CM | POA: Diagnosis not present

## 2017-12-09 DIAGNOSIS — Z79899 Other long term (current) drug therapy: Secondary | ICD-10-CM | POA: Diagnosis not present

## 2017-12-09 DIAGNOSIS — Z8673 Personal history of transient ischemic attack (TIA), and cerebral infarction without residual deficits: Secondary | ICD-10-CM | POA: Diagnosis not present

## 2017-12-09 DIAGNOSIS — R4189 Other symptoms and signs involving cognitive functions and awareness: Secondary | ICD-10-CM | POA: Diagnosis not present

## 2017-12-09 DIAGNOSIS — R4182 Altered mental status, unspecified: Secondary | ICD-10-CM | POA: Diagnosis not present

## 2017-12-09 DIAGNOSIS — Z638 Other specified problems related to primary support group: Secondary | ICD-10-CM | POA: Diagnosis not present

## 2017-12-09 DIAGNOSIS — F419 Anxiety disorder, unspecified: Secondary | ICD-10-CM | POA: Diagnosis not present

## 2017-12-09 DIAGNOSIS — I739 Peripheral vascular disease, unspecified: Secondary | ICD-10-CM | POA: Diagnosis not present

## 2017-12-09 DIAGNOSIS — R413 Other amnesia: Secondary | ICD-10-CM | POA: Diagnosis not present

## 2017-12-16 DIAGNOSIS — C61 Malignant neoplasm of prostate: Secondary | ICD-10-CM | POA: Diagnosis not present

## 2017-12-16 DIAGNOSIS — Z125 Encounter for screening for malignant neoplasm of prostate: Secondary | ICD-10-CM | POA: Diagnosis not present

## 2017-12-16 DIAGNOSIS — R32 Unspecified urinary incontinence: Secondary | ICD-10-CM | POA: Diagnosis not present

## 2017-12-22 DIAGNOSIS — M170 Bilateral primary osteoarthritis of knee: Secondary | ICD-10-CM | POA: Diagnosis not present

## 2017-12-27 DIAGNOSIS — R413 Other amnesia: Secondary | ICD-10-CM | POA: Diagnosis not present

## 2018-01-04 DIAGNOSIS — F411 Generalized anxiety disorder: Secondary | ICD-10-CM | POA: Diagnosis not present

## 2018-01-12 DIAGNOSIS — I10 Essential (primary) hypertension: Secondary | ICD-10-CM | POA: Diagnosis not present

## 2018-01-12 DIAGNOSIS — I2510 Atherosclerotic heart disease of native coronary artery without angina pectoris: Secondary | ICD-10-CM | POA: Diagnosis not present

## 2018-01-12 DIAGNOSIS — E119 Type 2 diabetes mellitus without complications: Secondary | ICD-10-CM | POA: Diagnosis not present

## 2018-01-12 DIAGNOSIS — Z8673 Personal history of transient ischemic attack (TIA), and cerebral infarction without residual deficits: Secondary | ICD-10-CM | POA: Diagnosis not present

## 2018-01-12 DIAGNOSIS — R413 Other amnesia: Secondary | ICD-10-CM | POA: Diagnosis not present

## 2018-01-12 DIAGNOSIS — E782 Mixed hyperlipidemia: Secondary | ICD-10-CM | POA: Diagnosis not present

## 2018-01-28 DIAGNOSIS — J209 Acute bronchitis, unspecified: Secondary | ICD-10-CM | POA: Diagnosis not present

## 2018-01-28 DIAGNOSIS — J0100 Acute maxillary sinusitis, unspecified: Secondary | ICD-10-CM | POA: Diagnosis not present

## 2018-02-03 DIAGNOSIS — R509 Fever, unspecified: Secondary | ICD-10-CM | POA: Diagnosis not present

## 2018-02-03 DIAGNOSIS — R05 Cough: Secondary | ICD-10-CM | POA: Diagnosis not present

## 2018-02-03 DIAGNOSIS — J209 Acute bronchitis, unspecified: Secondary | ICD-10-CM | POA: Diagnosis not present

## 2018-02-04 DIAGNOSIS — I712 Thoracic aortic aneurysm, without rupture: Secondary | ICD-10-CM | POA: Diagnosis not present

## 2018-02-04 DIAGNOSIS — I440 Atrioventricular block, first degree: Secondary | ICD-10-CM | POA: Diagnosis not present

## 2018-02-04 DIAGNOSIS — I4510 Unspecified right bundle-branch block: Secondary | ICD-10-CM | POA: Diagnosis not present

## 2018-02-04 DIAGNOSIS — J209 Acute bronchitis, unspecified: Secondary | ICD-10-CM | POA: Diagnosis not present

## 2018-02-04 DIAGNOSIS — E1165 Type 2 diabetes mellitus with hyperglycemia: Secondary | ICD-10-CM | POA: Diagnosis not present

## 2018-02-04 DIAGNOSIS — R0602 Shortness of breath: Secondary | ICD-10-CM | POA: Diagnosis not present

## 2018-02-04 DIAGNOSIS — R05 Cough: Secondary | ICD-10-CM | POA: Diagnosis not present

## 2018-02-04 DIAGNOSIS — Z8709 Personal history of other diseases of the respiratory system: Secondary | ICD-10-CM | POA: Diagnosis not present

## 2018-02-04 DIAGNOSIS — I493 Ventricular premature depolarization: Secondary | ICD-10-CM | POA: Diagnosis not present

## 2018-02-04 DIAGNOSIS — R7989 Other specified abnormal findings of blood chemistry: Secondary | ICD-10-CM | POA: Diagnosis not present

## 2018-02-07 DIAGNOSIS — C61 Malignant neoplasm of prostate: Secondary | ICD-10-CM | POA: Diagnosis not present

## 2018-02-07 DIAGNOSIS — Z192 Hormone resistant malignancy status: Secondary | ICD-10-CM | POA: Diagnosis not present

## 2018-08-16 IMAGING — CR DG CHEST 2V
2 series · 2 of 2 positions shown · non-contrast
Comparison: 11/03/2010

CLINICAL DATA: Shortness of breath for several months

EXAM:
CHEST - 2 VIEW

[w chest pa]
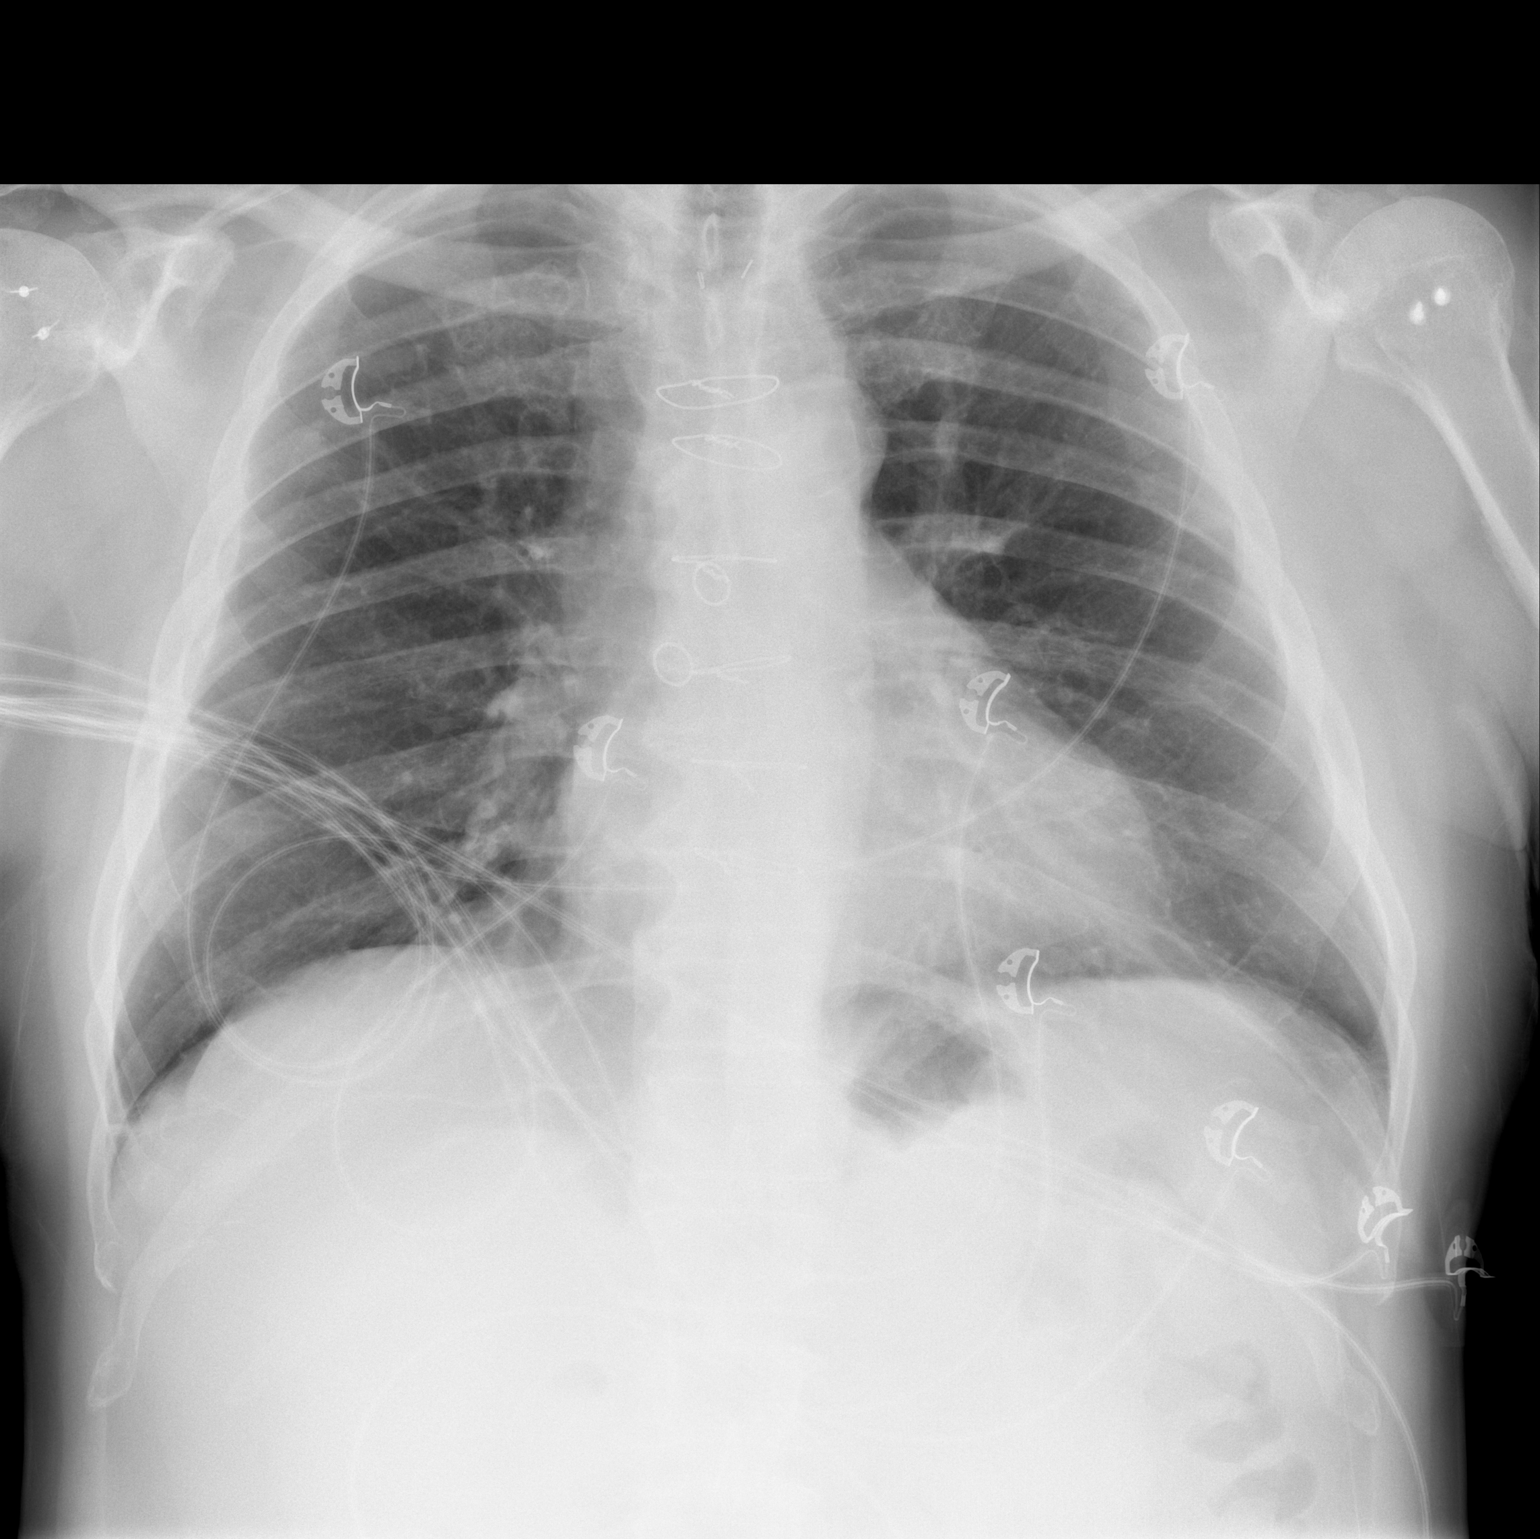

[w chest lat]
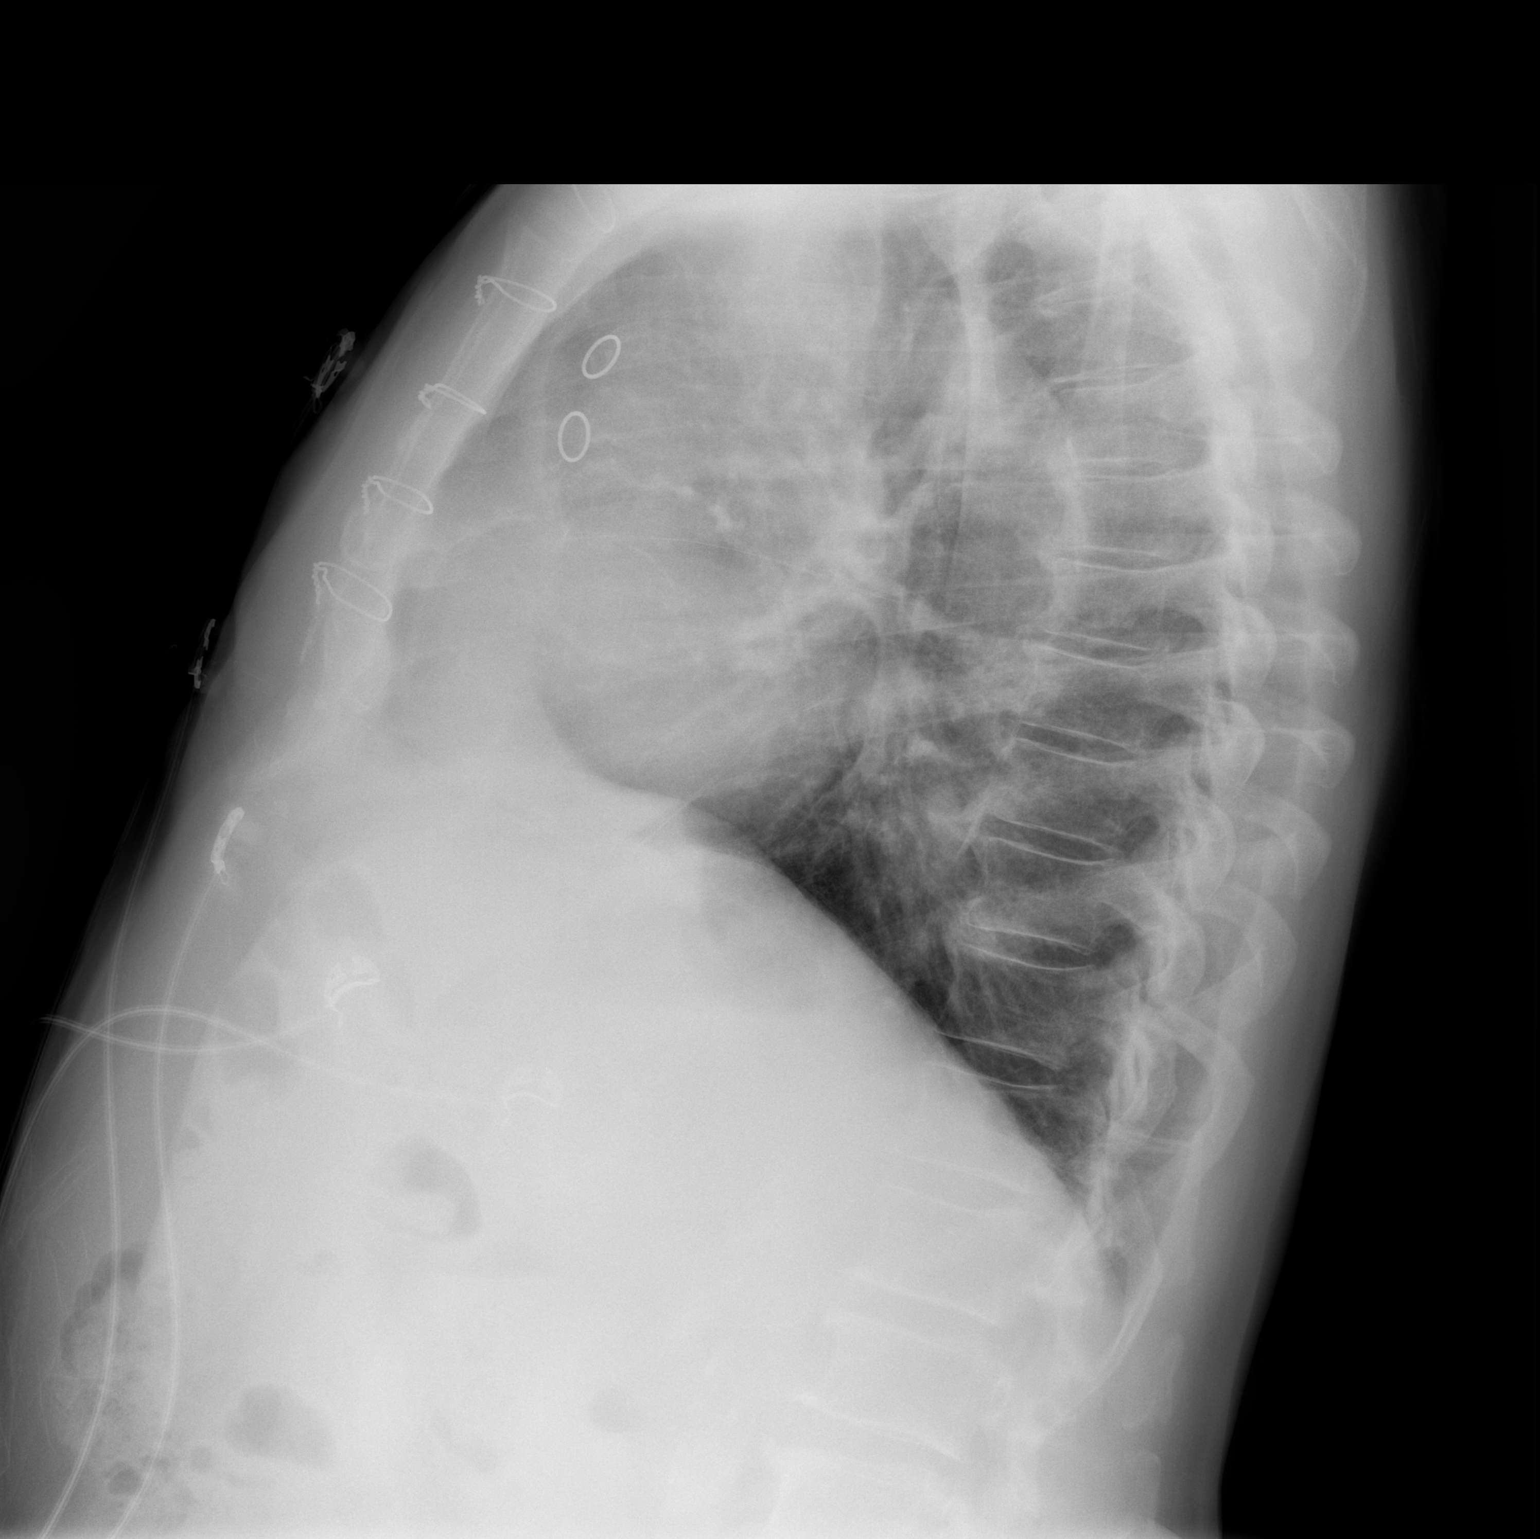

[2 of 2 positions shown; findings below may reference images not displayed]

FINDINGS: Cardiac shadow is within normal limits. The lungs are well aerated
bilaterally. Postsurgical changes are again seen and stable. No bony
abnormality is noted.
IMPRESSION: No active cardiopulmonary disease.

## 2021-05-25 ENCOUNTER — Emergency Department (HOSPITAL_COMMUNITY)
Admission: EM | Admit: 2021-05-25 | Discharge: 2021-05-25 | Disposition: A | Discharge status: Home / Self Care | Payer: Medicare HMO | Attending: Emergency Medicine | Admitting: Emergency Medicine

## 2021-05-25 ENCOUNTER — Encounter (HOSPITAL_COMMUNITY): Payer: Self-pay

## 2021-05-25 ENCOUNTER — Emergency Department (HOSPITAL_COMMUNITY): Payer: Medicare HMO

## 2021-05-25 DIAGNOSIS — Z5321 Procedure and treatment not carried out due to patient leaving prior to being seen by health care provider: Secondary | ICD-10-CM | POA: Diagnosis not present

## 2021-05-25 DIAGNOSIS — I10 Essential (primary) hypertension: Secondary | ICD-10-CM | POA: Diagnosis not present

## 2021-05-25 DIAGNOSIS — E119 Type 2 diabetes mellitus without complications: Secondary | ICD-10-CM | POA: Diagnosis not present

## 2021-05-25 DIAGNOSIS — R600 Localized edema: Secondary | ICD-10-CM | POA: Diagnosis present

## 2021-05-25 DIAGNOSIS — I2510 Atherosclerotic heart disease of native coronary artery without angina pectoris: Secondary | ICD-10-CM | POA: Insufficient documentation

## 2021-05-25 DIAGNOSIS — M7989 Other specified soft tissue disorders: Secondary | ICD-10-CM | POA: Insufficient documentation

## 2021-05-25 LAB — TROPONIN I (HIGH SENSITIVITY): Troponin I (High Sensitivity): 9 ng/L (ref <18)

## 2021-05-25 LAB — BRAIN NATRIURETIC PEPTIDE: B Natriuretic Peptide: 437.7 pg/mL — ABNORMAL HIGH (ref 0.0–100.0)

## 2021-05-25 LAB — COMPREHENSIVE METABOLIC PANEL
ALT: 17 U/L (ref 0–44)
AST: 17 U/L (ref 15–41)
Albumin: 3.8 g/dL (ref 3.5–5.0)
Alkaline Phosphatase: 47 U/L (ref 38–126)
Anion gap: 7 (ref 5–15)
BUN: 34 mg/dL — ABNORMAL HIGH (ref 8–23)
CO2: 23 mmol/L (ref 22–32)
Calcium: 9.6 mg/dL (ref 8.9–10.3)
Chloride: 109 mmol/L (ref 98–111)
Creatinine, Ser: 1.09 mg/dL (ref 0.61–1.24)
GFR, Estimated: >60 mL/min (ref >60)
Glucose, Bld: 183 mg/dL — ABNORMAL HIGH (ref 70–99)
Potassium: 4.1 mmol/L (ref 3.5–5.1)
Sodium: 139 mmol/L (ref 135–145)
Total Bilirubin: 0.3 mg/dL (ref 0.3–1.2)
Total Protein: 6.6 g/dL (ref 6.5–8.1)

## 2021-05-25 LAB — CK: Total CK: 96 U/L (ref 49–397)

## 2021-05-25 NOTE — ED Triage Notes (Signed)
Pt arrived via POV, c/o bilateral hand swelling and ankle swelling.  ?

## 2021-05-25 NOTE — ED Provider Triage Note (Signed)
Emergency Medicine Provider Triage Evaluation Note ? ?ROMULUS HANRAHAN , a 81 y.o. male with history significant for CAD status post triple bypass and stent placement, hypertension, diabetes was evaluated in triage.  Pt complains of bilateral upper and lower extremity swelling. Pt first noted swelling of the left hand approx 1 week ago that also seem bruising.  Patient's wife called PCP and told him that they were going to discontinue his blood thinner to see if this improves the bruising.  A few days ago, the right hand also began to swell and bruise.  This morning patient became concerned that his bilateral feet were swollen, slightly blue and felt cold.  He is ambulatory.  He denies chest pain although endorses very mild shortness of breath.  He denies abdominal pain, nausea, vomiting, diarrhea.  He has no previous history of CHF.. ? ?Review of Systems  ?Positive: As above ?Negative: As above ? ?Physical Exam  ?BP (!) 144/65 (BP Location: Left Arm)   Pulse 74   Temp 98.1 ?F (36.7 ?C) (Oral)   Resp 18   SpO2 94%  ?Gen:   Awake, no distress   ?Resp:  Normal effort, lungs CTA bilaterally ?MSK:   Moves extremities without difficulty  ?Other:  Bilateral upper and lower distal extremities with mild to moderate nonpitting edema, DP pulses not appreciable even with Doppler, PT pulses present with Doppler. ?Radial pulses, 2+ left, 1+ right ? ?Medical Decision Making  ?Medically screening exam initiated at 5:48 PM.  Appropriate orders placed.  Brandy Hale was informed that the remainder of the evaluation will be completed by another provider, this initial triage assessment does not replace that evaluation, and the importance of remaining in the ED until their evaluation is complete. ? ? ?  ?Tonye Pearson, [place] ?05/25/21 1751 ? ?

## 2022-08-21 IMAGING — CR DG CHEST 2V
2 series · 2 of 2 positions shown · non-contrast
Comparison: 02/04/2018

CLINICAL DATA: Hand and ankle swelling

EXAM:
CHEST - 2 VIEW

[w chest pa]
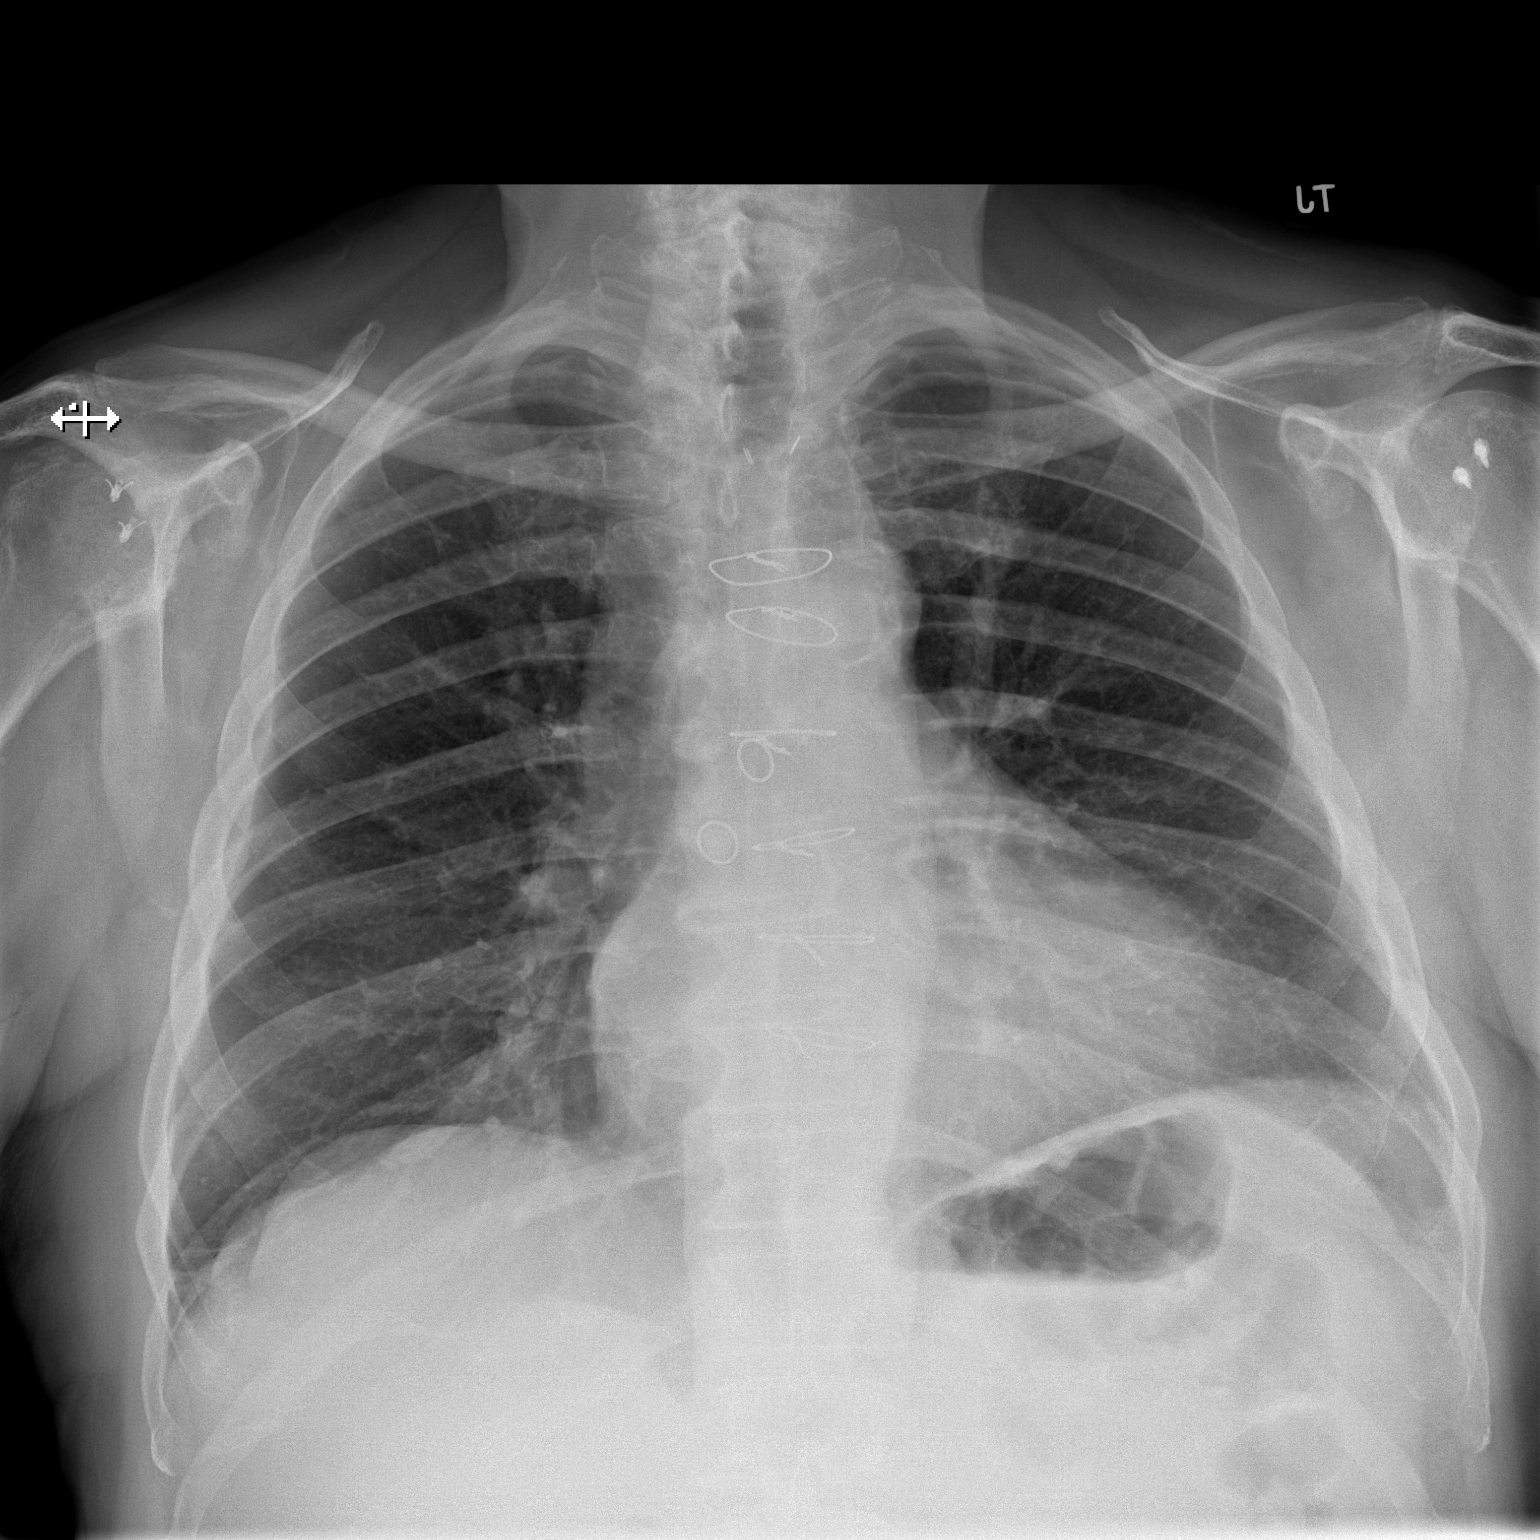

[w chest lat]
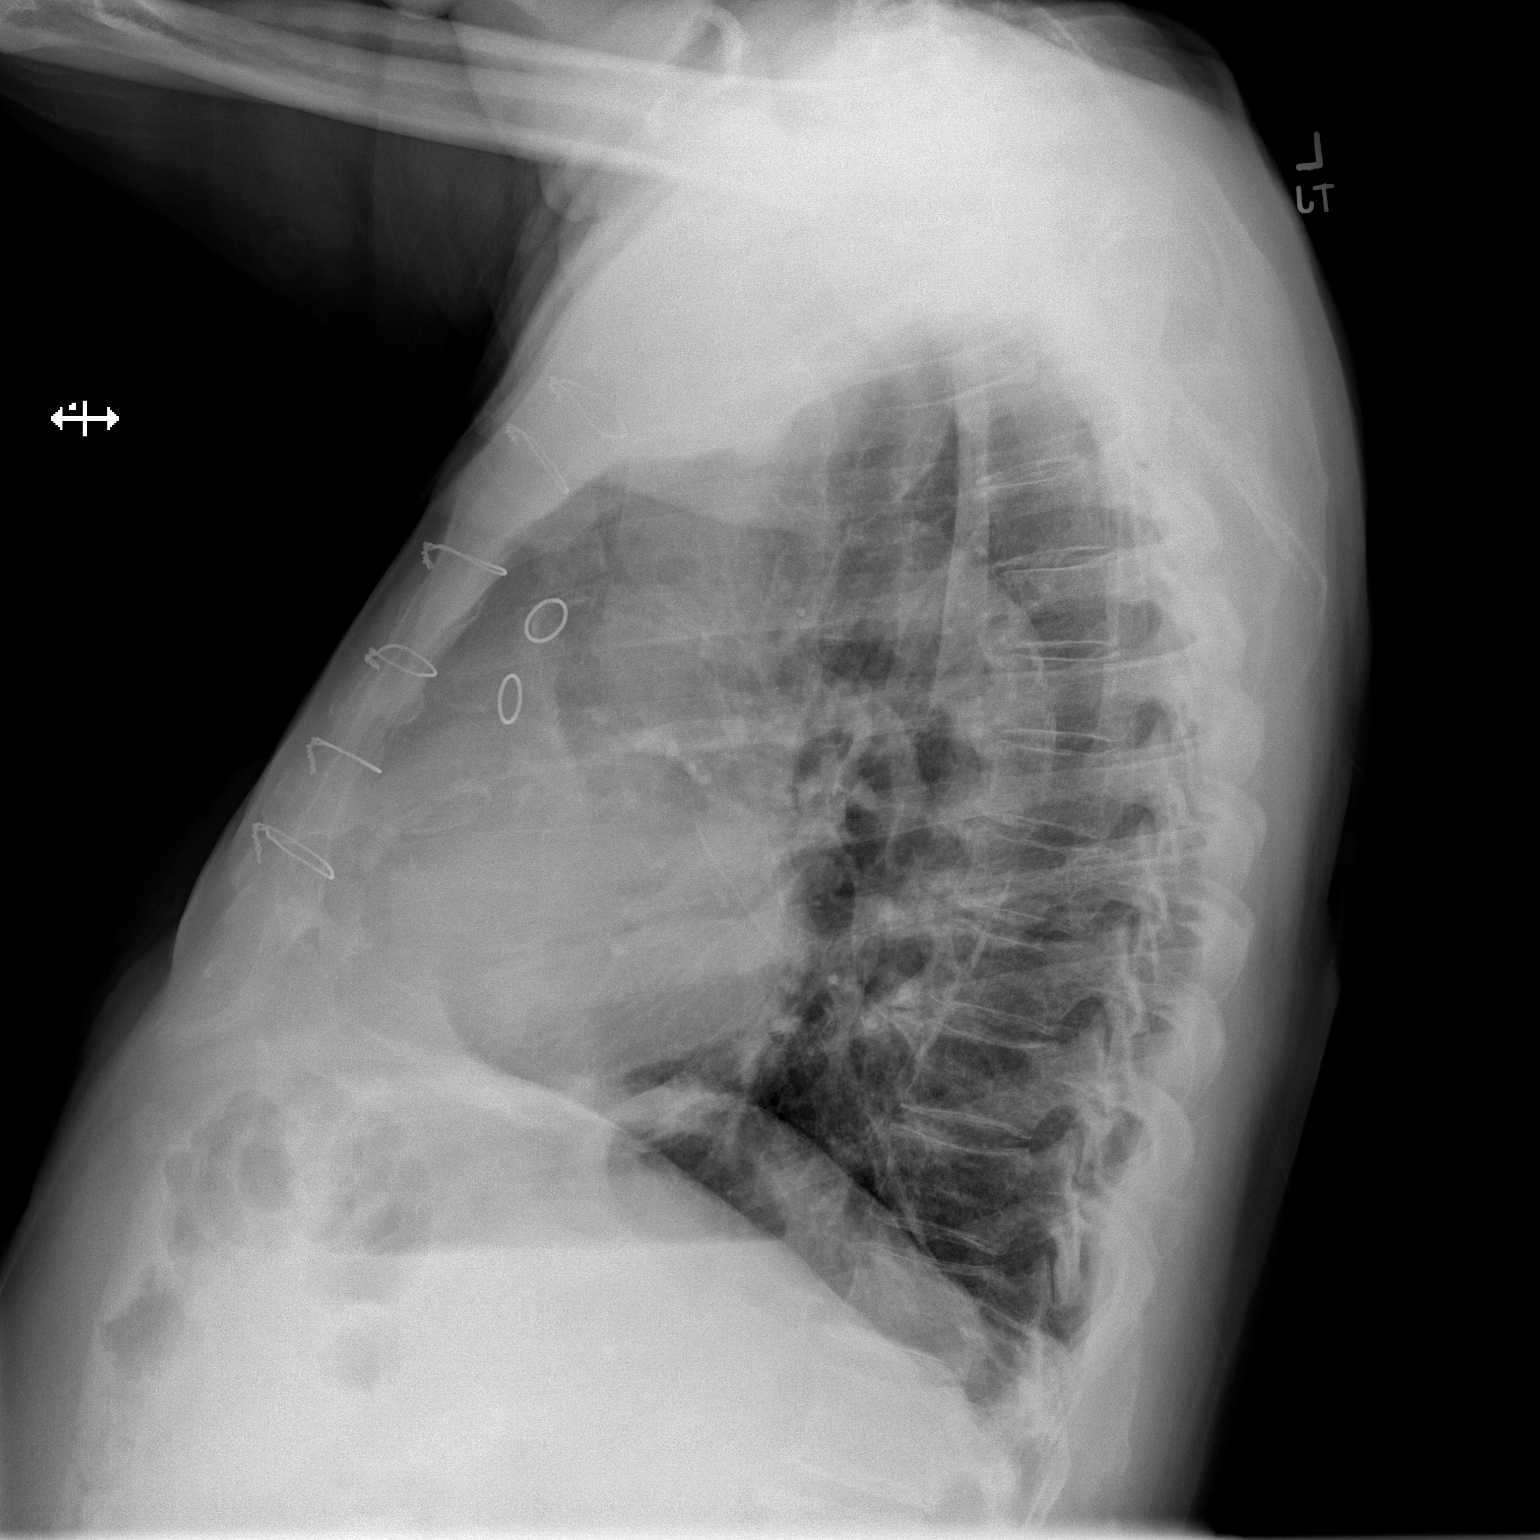

[2 of 2 positions shown; findings below may reference images not displayed]

FINDINGS: Status post median sternotomy and CABG. Enlarged cardiac contour.
Normal mediastinal contours. Aortic atherosclerosis. No focal
pulmonary opacity. No pleural effusion or pneumothorax. No acute
osseous abnormality.
IMPRESSION: Cardiomegaly.  No additional acute cardiopulmonary process.

## 2023-10-18 DIAGNOSIS — I440 Atrioventricular block, first degree: Secondary | ICD-10-CM | POA: Diagnosis not present

## 2023-10-18 DIAGNOSIS — E785 Hyperlipidemia, unspecified: Secondary | ICD-10-CM | POA: Diagnosis not present

## 2023-10-18 DIAGNOSIS — R079 Chest pain, unspecified: Secondary | ICD-10-CM | POA: Diagnosis not present

## 2023-10-18 DIAGNOSIS — I4510 Unspecified right bundle-branch block: Secondary | ICD-10-CM | POA: Diagnosis not present

## 2023-10-18 DIAGNOSIS — I2510 Atherosclerotic heart disease of native coronary artery without angina pectoris: Secondary | ICD-10-CM | POA: Diagnosis not present

## 2023-10-18 DIAGNOSIS — R7989 Other specified abnormal findings of blood chemistry: Secondary | ICD-10-CM

## 2023-10-18 DIAGNOSIS — I10 Essential (primary) hypertension: Secondary | ICD-10-CM

## 2023-10-18 DIAGNOSIS — Z951 Presence of aortocoronary bypass graft: Secondary | ICD-10-CM | POA: Diagnosis not present

## 2023-10-19 DIAGNOSIS — I517 Cardiomegaly: Secondary | ICD-10-CM | POA: Diagnosis not present

## 2023-10-19 DIAGNOSIS — I2510 Atherosclerotic heart disease of native coronary artery without angina pectoris: Secondary | ICD-10-CM | POA: Diagnosis not present

## 2023-10-19 DIAGNOSIS — I4510 Unspecified right bundle-branch block: Secondary | ICD-10-CM | POA: Diagnosis not present

## 2023-10-19 DIAGNOSIS — Z951 Presence of aortocoronary bypass graft: Secondary | ICD-10-CM | POA: Diagnosis not present

## 2023-10-19 DIAGNOSIS — I351 Nonrheumatic aortic (valve) insufficiency: Secondary | ICD-10-CM | POA: Diagnosis not present

## 2023-10-19 DIAGNOSIS — R079 Chest pain, unspecified: Secondary | ICD-10-CM | POA: Diagnosis not present

## 2023-10-19 DIAGNOSIS — E785 Hyperlipidemia, unspecified: Secondary | ICD-10-CM | POA: Diagnosis not present

## 2023-10-19 DIAGNOSIS — I361 Nonrheumatic tricuspid (valve) insufficiency: Secondary | ICD-10-CM | POA: Diagnosis not present

## 2024-01-09 DEATH — deceased
